# Patient Record
Sex: Female | Born: 1960 | Race: White | Hispanic: No | Marital: Married | Smoking: Never smoker
Health system: Southern US, Community
[De-identification: ages and names within clinical notes are randomized; demographics above are authoritative.]

## PROBLEM LIST (undated history)

## (undated) DIAGNOSIS — C19 Malignant neoplasm of rectosigmoid junction: Secondary | ICD-10-CM

## (undated) DIAGNOSIS — Z91018 Allergy to other foods: Secondary | ICD-10-CM

## (undated) DIAGNOSIS — F419 Anxiety disorder, unspecified: Secondary | ICD-10-CM

## (undated) DIAGNOSIS — G629 Polyneuropathy, unspecified: Secondary | ICD-10-CM

## (undated) HISTORY — DX: Anxiety disorder, unspecified: F41.9

## (undated) HISTORY — DX: Polyneuropathy, unspecified: G62.9

## (undated) HISTORY — DX: Allergy to other foods: Z91.018

---
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
## 2006-05-24 DIAGNOSIS — C19 Malignant neoplasm of rectosigmoid junction: Secondary | ICD-10-CM

## 2006-05-24 HISTORY — DX: Malignant neoplasm of rectosigmoid junction: C19

## 2007-03-16 ENCOUNTER — Ambulatory Visit (HOSPITAL_COMMUNITY): Admission: RE | Admit: 2007-03-16 | Discharge: 2007-03-16 | Payer: Self-pay | Admitting: Gastroenterology

## 2007-03-17 ENCOUNTER — Ambulatory Visit: Admission: RE | Admit: 2007-03-17 | Discharge: 2007-05-23 | Payer: Self-pay | Admitting: Radiation Oncology

## 2007-03-24 ENCOUNTER — Ambulatory Visit: Payer: Self-pay | Admitting: Gastroenterology

## 2009-10-16 ENCOUNTER — Encounter: Admission: RE | Admit: 2009-10-16 | Discharge: 2009-10-16 | Payer: Self-pay | Admitting: Neurosurgery

## 2009-12-01 ENCOUNTER — Encounter: Admission: RE | Admit: 2009-12-01 | Discharge: 2009-12-01 | Payer: Self-pay | Admitting: Neurosurgery

## 2010-04-02 ENCOUNTER — Ambulatory Visit: Payer: Self-pay | Admitting: Internal Medicine

## 2010-04-02 ENCOUNTER — Ambulatory Visit (HOSPITAL_COMMUNITY): Admission: RE | Admit: 2010-04-02 | Discharge: 2010-04-02 | Payer: Self-pay | Admitting: Internal Medicine

## 2011-04-01 ENCOUNTER — Encounter (INDEPENDENT_AMBULATORY_CARE_PROVIDER_SITE_OTHER): Payer: Self-pay | Admitting: *Deleted

## 2012-02-01 ENCOUNTER — Encounter: Payer: Self-pay | Admitting: Hematology and Oncology

## 2012-02-01 DIAGNOSIS — C20 Malignant neoplasm of rectum: Secondary | ICD-10-CM

## 2016-06-25 ENCOUNTER — Encounter: Payer: Self-pay | Admitting: Internal Medicine

## 2018-12-13 ENCOUNTER — Encounter (INDEPENDENT_AMBULATORY_CARE_PROVIDER_SITE_OTHER): Payer: Self-pay | Admitting: *Deleted

## 2018-12-13 ENCOUNTER — Other Ambulatory Visit: Payer: Self-pay

## 2018-12-13 ENCOUNTER — Ambulatory Visit (INDEPENDENT_AMBULATORY_CARE_PROVIDER_SITE_OTHER): Payer: Medicare HMO | Admitting: Internal Medicine

## 2018-12-13 ENCOUNTER — Encounter (INDEPENDENT_AMBULATORY_CARE_PROVIDER_SITE_OTHER): Payer: Self-pay | Admitting: Internal Medicine

## 2018-12-13 VITALS — BP 148/82 | HR 68 | Temp 98.2°F | Resp 18 | Ht 65.0 in | Wt 147.9 lb

## 2018-12-13 DIAGNOSIS — K5900 Constipation, unspecified: Secondary | ICD-10-CM | POA: Insufficient documentation

## 2018-12-13 DIAGNOSIS — Z85048 Personal history of other malignant neoplasm of rectum, rectosigmoid junction, and anus: Secondary | ICD-10-CM

## 2018-12-13 DIAGNOSIS — R634 Abnormal weight loss: Secondary | ICD-10-CM

## 2018-12-13 NOTE — Progress Notes (Signed)
Subjective:    Patient ID: Alexandra Irwin, female    DOB: 10-05-1960, 58 y.o.   MRN: [id number]  HPI: Alexandra Irwin is a 58 y.o. female with a past medical history significant for adenocarcinoma of the rectum diagnosed by colonoscopy 03/07/2007 completed by Dr. Laural Golden. She underwent chemotherapy and radiation therapy, s/p low anterior resection complicated by a postoperative anastomotic leak which required a colostomy then subsequent colostomy take down. She reported having surveillance colonoscopies every year for approximately  5 years then every 2 to 3 years there after. Her most recent colonoscopy was completed by  Dr. Orvan Falconer Oct or Nov. 2018. She was advised to repeat a colonoscopy in 3 years. She presents today with complains of increased mucous per the rectum sometimes associated with rectal pressure. No significant rectal pain. She reports having mucous per the rectum every since her rectal resection, however, the quantity of mucous has recently increased. She describes the mucous as yellow brown, sometimes with a reddish tint. No abdominal pain. She has chronic constipation, passes small balls of stool every few days. She used Linzess in the past which wiped her out. Miralax resulted in fecal leakage. She had bloody diarrhea with lower abdominal pain 8 months ago which lasted for 1 day. She contacted Dr. Jacqlyn Larsen physician assistant who advised close monitoring, endoscopic evaluation was not recommended. Her symptoms resolved without recurrence. She has infrequent episodes of a pinching discomfort to there right mid to lower abdomen which resolves after she stretches. I discussed possible adhesions from her prior surgeries may be contributing to these episodes. She denies feeling any bulge/hernia to the right mid abdomen. She endorses losing 10 lbs over the past year but has recently gained back 7lbs (usually weighs 150lbs, currently weighs 147lbs). No fever, sweats or chills. She wishes to schedule  a colonoscopy as she worries about rectal cancer recurrence. She elected to schedule her colonoscopy with Dr. Laural Golden as she had difficulty communication with Dr. Jacqlyn Larsen office. No other complaints today.   Colonoscopy reports in chart as follows: Surveillance colonoscopy 06/2008: a large ulcer at the colorectal anastomosis site and a small adenomatous polyp at the hepatic flexure.  Surveillance colonoscopy 04/02/2010: A pediatric Pentax colonoscopy was utilized. The colorectal anastomosis was identified 5cm proximal to the dentate line. The ulcer to which the lower aspect from 3 to 9 o'clock had completely healed. The ulcer towards the superior wall was partially healed, marked by clip in the center. No mass in this area. There were 3 small erosions in the region of the hepatic flexure site of colonic anastomosis from the takedown colostomy. The scope was passed into the cecum and the IC valve was visualized.  Colonoscopy 07/17/2010: Radiation ulceration, mild, at rectal anastomosis. Proximal anastomosis. No polyps. Small fistulous type recession seen on entry.   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,3-galactose    Anxiety    Colorectal cancer ([place]) 2008   Neuropathy     Past Surgical History:  Procedure Laterality Date   APPENDECTOMY     BREAST ENHANCEMENT SURGERY     CESAREAN SECTION     Patient has had x 2   COLECTOMY     COLONOSCOPY     COLOSTOMY     COLOSTOMY REVERSAL     HYSTERECTOMY ABDOMINAL WITH SALPINGO-OOPHORECTOMY     RECTAL SURGERY  2009   [facility]orectal surgery at [facility]   TONSILLECTOMY      No Known Allergies  Current Outpatient  Medications on File Prior to Visit  Medication Sig Dispense Refill   ergocalciferol (VITAMIN D2) 1.25 MG (50000 UT) capsule Take 50,000 Units by mouth once a week.     ibuprofen (ADVIL) 200 MG tablet Take 200 mg by mouth as needed for headache.     sertraline (ZOLOFT) 50 MG tablet Take  50 mg by mouth daily.     No current facility-administered medications on file prior to visit.      Review of Systems  Constitutional: 10 lbs wt loss, gained 7 lbs back Eyes: no visual deficits. Respiratory: Denies cough or SOB. Cardiovascular: Denies chest pain or palpitations. Gastrointestinal: See HPI. Denies dysphagia or heartburn. GU: + thickened vaginal wall, dyspareunia. No dysuria or hematuria. Musculoskeletal: + knee pain, past back pain. Neuro: LE paresthesia secondary to chemotherapy.     Objective:   Physical Exam  Head: Normocephalic. Eyes: No scleral icterus, conjunctiva pink. Mouth: Dentition intact, posterior pharynx pink. Neck: No lymphadenopathy or thyromegaly. Heart: S1,S2, no murmurs. Lungs: Clear throughout. Abdomen: Soft, nontender, + BS x 4 quads, RUQ scar and lower midline scar intact. No HSM. Rectal: Patient declined rectal exam. Extremities: No edema. Neuro: Alert and oriented x 4. No focal deficits.     Assessment & Plan:  1. 58 y.o. female with a hx or rectal cancer 2008 s/p LAR, chemo, radiation, colostomy, colostomy takedown who presents with complaints of constipation and increased mucous per there rectum. -schedule a colonoscopy with Dr. Laural Golden, patient request MIralax bowel prep - Colace 100mg  po bid - 2018 colonoscopy from Dr. Jacqlyn Larsen requested -further follow up to be determined after colonoscopy completed   2. Weight loss, resolving -continue to monitor weight -consider abd/pelvic CT if weight loss recurs

## 2018-12-13 NOTE — Patient Instructions (Signed)
1. Schedule a colonoscopy with Dr. Laural Golden 2. Follow up to be determined after colonoscopy completed. 3. Colace 100mg  1 capsule twice daily as needed

## 2018-12-18 ENCOUNTER — Other Ambulatory Visit (HOSPITAL_COMMUNITY): Payer: Medicare HMO

## 2018-12-20 ENCOUNTER — Other Ambulatory Visit (HOSPITAL_COMMUNITY)
Admission: RE | Admit: 2018-12-20 | Discharge: 2018-12-20 | Disposition: A | Discharge status: Home / Self Care | Payer: Medicare HMO | Source: Ambulatory Visit | Attending: Internal Medicine | Admitting: Internal Medicine

## 2018-12-20 ENCOUNTER — Other Ambulatory Visit: Payer: Self-pay

## 2018-12-20 ENCOUNTER — Encounter (HOSPITAL_COMMUNITY)
Admission: RE | Admit: 2018-12-20 | Discharge: 2018-12-20 | Disposition: A | Discharge status: Home / Self Care | Payer: Medicare HMO | Source: Ambulatory Visit | Attending: Internal Medicine | Admitting: Internal Medicine

## 2018-12-20 ENCOUNTER — Ambulatory Visit (HOSPITAL_COMMUNITY): Admit: 2018-12-20 | Payer: Medicare HMO | Admitting: Internal Medicine

## 2018-12-20 ENCOUNTER — Encounter (HOSPITAL_COMMUNITY): Payer: Self-pay

## 2018-12-20 DIAGNOSIS — Z20828 Contact with and (suspected) exposure to other viral communicable diseases: Secondary | ICD-10-CM | POA: Diagnosis present

## 2018-12-20 DIAGNOSIS — Z85048 Personal history of other malignant neoplasm of rectum, rectosigmoid junction, and anus: Secondary | ICD-10-CM

## 2018-12-20 DIAGNOSIS — R634 Abnormal weight loss: Secondary | ICD-10-CM

## 2018-12-20 LAB — SARS CORONAVIRUS 2 (TAT 6-24 HRS): SARS Coronavirus 2: NEGATIVE

## 2018-12-20 SURGERY — COLONOSCOPY
Anesthesia: Moderate Sedation

## 2018-12-22 ENCOUNTER — Ambulatory Visit (HOSPITAL_COMMUNITY)
Admission: RE | Admit: 2018-12-22 | Discharge: 2018-12-22 | Disposition: A | Discharge status: Home / Self Care | Payer: Medicare HMO | Attending: Internal Medicine | Admitting: Internal Medicine

## 2018-12-22 ENCOUNTER — Ambulatory Visit (HOSPITAL_COMMUNITY): Payer: Medicare HMO | Admitting: Anesthesiology

## 2018-12-22 ENCOUNTER — Encounter (HOSPITAL_COMMUNITY)
Admission: RE | Disposition: A | Discharge status: Home / Self Care | Payer: Self-pay | Source: Home / Self Care | Attending: Internal Medicine

## 2018-12-22 ENCOUNTER — Encounter (HOSPITAL_COMMUNITY): Payer: Self-pay | Admitting: *Deleted

## 2018-12-22 DIAGNOSIS — Z85048 Personal history of other malignant neoplasm of rectum, rectosigmoid junction, and anus: Secondary | ICD-10-CM | POA: Diagnosis not present

## 2018-12-22 DIAGNOSIS — Z9049 Acquired absence of other specified parts of digestive tract: Secondary | ICD-10-CM | POA: Insufficient documentation

## 2018-12-22 DIAGNOSIS — Z980 Intestinal bypass and anastomosis status: Secondary | ICD-10-CM | POA: Insufficient documentation

## 2018-12-22 DIAGNOSIS — K633 Ulcer of intestine: Secondary | ICD-10-CM

## 2018-12-22 DIAGNOSIS — F419 Anxiety disorder, unspecified: Secondary | ICD-10-CM | POA: Insufficient documentation

## 2018-12-22 DIAGNOSIS — Z79899 Other long term (current) drug therapy: Secondary | ICD-10-CM | POA: Insufficient documentation

## 2018-12-22 DIAGNOSIS — G629 Polyneuropathy, unspecified: Secondary | ICD-10-CM | POA: Insufficient documentation

## 2018-12-22 DIAGNOSIS — K6289 Other specified diseases of anus and rectum: Secondary | ICD-10-CM | POA: Diagnosis present

## 2018-12-22 DIAGNOSIS — K5900 Constipation, unspecified: Secondary | ICD-10-CM | POA: Diagnosis not present

## 2018-12-22 DIAGNOSIS — R634 Abnormal weight loss: Secondary | ICD-10-CM

## 2018-12-22 HISTORY — PX: BIOPSY: SHX5522

## 2018-12-22 HISTORY — PX: COLONOSCOPY WITH PROPOFOL: SHX5780

## 2018-12-22 SURGERY — COLONOSCOPY WITH PROPOFOL
Anesthesia: General

## 2018-12-22 MED ORDER — CHLORHEXIDINE GLUCONATE CLOTH 2 % EX PADS: 6.0000 | MEDICATED_PAD | Freq: Once | CUTANEOUS | Status: DC

## 2018-12-22 MED ORDER — PROMETHAZINE HCL 25 MG/ML IJ SOLN: 6.2500 mg | INTRAMUSCULAR | Status: DC | PRN

## 2018-12-22 MED ORDER — PROPOFOL 500 MG/50ML IV EMUL
INTRAVENOUS | Status: DC | PRN
  Administered 2018-12-22: 150 ug/kg/min via INTRAVENOUS

## 2018-12-22 MED ORDER — METAMUCIL SMOOTH TEXTURE 58.6 % PO POWD: 1.0000 | Freq: Every day | ORAL | 12 refills | Status: AC

## 2018-12-22 MED ORDER — HYDROCODONE-ACETAMINOPHEN 7.5-325 MG PO TABS: 1.0000 | ORAL_TABLET | Freq: Once | ORAL | Status: DC | PRN

## 2018-12-22 MED ORDER — HYDROMORPHONE HCL 1 MG/ML IJ SOLN: 0.2500 mg | INTRAMUSCULAR | Status: DC | PRN

## 2018-12-22 MED ORDER — DOCUSATE SODIUM 100 MG PO CAPS: 200.0000 mg | ORAL_CAPSULE | Freq: Every day | ORAL | 0 refills | Status: AC

## 2018-12-22 MED ORDER — PROPOFOL 10 MG/ML IV BOLUS
INTRAVENOUS | Status: DC | PRN
  Administered 2018-12-22: 4 mg via INTRAVENOUS
  Administered 2018-12-22: 20 mg via INTRAVENOUS

## 2018-12-22 MED ORDER — MIDAZOLAM HCL 2 MG/2ML IJ SOLN: 0.5000 mg | Freq: Once | INTRAMUSCULAR | Status: DC | PRN

## 2018-12-22 MED ORDER — LACTATED RINGERS IV SOLN
INTRAVENOUS | Status: DC
  Administered 2018-12-22: 1000 mL via INTRAVENOUS

## 2018-12-22 NOTE — Op Note (Addendum)
[facility] Patient Name: Alexandra Irwin Procedure Date: 12/22/2018 8:02 AM MRN: [id number] Date of Birth: July 13, 1960 Attending MD: Hildred Laser , MD CSN: [id number] Age: 58 Admit Type: Outpatient Procedure:                Colonoscopy Indications:              Constipation, rectal discharge Providers:                Hildred Laser, MD, Otis Peak B. Sharon Seller, RN, Nelma Rothman, Technician Referring MD:             Theodosia Paling, FNP Medicines:                Propofol per Anesthesia Complications:            No immediate complications. Estimated Blood Loss:     Estimated blood loss was minimal. Procedure:                Pre-Anesthesia Assessment:                           - Prior to the procedure, a History and Physical                            was performed, and patient medications and                            allergies were reviewed. The patient's tolerance of                            previous anesthesia was also reviewed. The risks                            and benefits of the procedure and the sedation                            options and risks were discussed with the patient.                            All questions were answered, and informed consent                            was obtained. Prior Anticoagulants: The patient has                            taken no previous anticoagulant or antiplatelet                            agents except for NSAID medication. ASA Grade                            Assessment: II - A patient with mild systemic  disease. After reviewing the risks and benefits,                            the patient was deemed in satisfactory condition to                            undergo the procedure.                           After obtaining informed consent, the colonoscope                            was passed under direct vision. Throughout the                            procedure, the patient's  blood pressure, pulse, and                            oxygen saturations were monitored continuously. The                            [id number]-[id number] ([id number]) scope was introduced through                            the anus and advanced to the the cecum, identified                            by appendiceal orifice and ileocecal valve. The                            colonoscopy was performed without difficulty. The                            patient tolerated the procedure well. The quality                            of the bowel preparation was adequate. The                            ileocecal valve, appendiceal orifice, and rectum                            were photographed. Scope In: 8:19:04 AM Scope Out: 8:32:55 AM Scope Withdrawal Time: 0 hours 9 minutes 29 seconds  Total Procedure Duration: 0 hours 13 minutes 51 seconds  Findings:      The perianal and digital rectal examinations were normal.      There was evidence of a prior end-to-end colo-colonic anastomosis at the       hepatic flexure. This was patent.      A few patchy non-bleeding erosions were found in the transverse colon.       Biopsies were taken with a cold forceps for histology. The pathology       specimen was placed into Bottle Number 1.      There was evidence of a prior end-to-end colo-colonic  anastomosis at 6       cm proximal to the anus. This was patent. Impression:               - Patent end-to-end colo-colonic anastomosis.                           - A few erosions in the transverse colon. Biopsied.                           - Patent end-to-end colo-colonic anastomosis. Moderate Sedation:      Per Anesthesia Care Recommendation:           - Patient has a contact number available for                            emergencies. The signs and symptoms of potential                            delayed complications were discussed with the                            patient. Return to normal activities tomorrow.                             Written discharge instructions were provided to the                            patient.                           - High fiber diet today.                           - Continue present medications.                           - Metamucil 4 g po qhs.                           - colace 200 mg po qhs.                           - Await pathology results.                           - Repeat colonoscopy in 3 years for surveillance. Procedure Code(s):        --- Professional ---                           [phone], Colonoscopy, flexible; with biopsy, single                            or multiple Diagnosis Code(s):        --- Professional ---                           Z98.0, Intestinal bypass and anastomosis  status                           K63.3, Ulcer of intestine                           K59.00, Constipation, unspecified CPT copyright 2019 American Medical Association. All rights reserved. The codes documented in this report are preliminary and upon coder review may  be revised to meet current compliance requirements. Hildred Laser, MD Hildred Laser, MD 12/22/2018 8:45:19 AM This report has been signed electronically. Number of Addenda: 0

## 2018-12-22 NOTE — Anesthesia Postprocedure Evaluation (Signed)
Anesthesia Post Note  Patient: Alexandra Irwin  Procedure(s) Performed: COLONOSCOPY WITH PROPOFOL (N/A ) BIOPSY  Patient location during evaluation: PACU Anesthesia Type: General Level of consciousness: awake and alert and oriented Pain management: pain level controlled Vital Signs Assessment: post-procedure vital signs reviewed and stable Respiratory status: spontaneous breathing Cardiovascular status: blood pressure returned to baseline and stable Postop Assessment: no apparent nausea or vomiting Anesthetic complications: no     Last Vitals:  Vitals:   12/22/18 0723 12/22/18 0839  BP: 132/68   Resp: 18   Temp: 36.8 C (P) 36.6 C  SpO2: 98%     Last Pain:  Vitals:   12/22/18 0813  TempSrc:   PainSc: 0-No pain                 Chandlar Guice

## 2018-12-22 NOTE — Anesthesia Preprocedure Evaluation (Signed)
Anesthesia Evaluation  Patient identified by MRN, date of birth, ID band Patient awake    Reviewed: Allergy & Precautions, NPO status , Patient's Chart, lab work & pertinent test results  Airway Mallampati: I  TM Distance: >3 FB Neck ROM: Full    Dental no notable dental hx. (+) Teeth Intact   Pulmonary neg pulmonary ROS,    Pulmonary exam normal breath sounds clear to auscultation       Cardiovascular Exercise Tolerance: Good negative cardio ROS Normal cardiovascular examI Rhythm:Regular Rate:Normal     Neuro/Psych Anxiety negative neurological ROS  negative psych ROS   GI/Hepatic negative GI ROS, Neg liver ROS, H/o Recatal Ca  S/p CT/RT 2008, then surg 2009 , then takedown 2009  Doing well  Here for surveillance    Endo/Other  negative endocrine ROS  Renal/GU negative Renal ROS  negative genitourinary   Musculoskeletal negative musculoskeletal ROS (+)   Abdominal   Peds negative pediatric ROS (+)  Hematology negative hematology ROS (+)   Anesthesia Other Findings   Reproductive/Obstetrics negative OB ROS                             Anesthesia Physical Anesthesia Plan  ASA: II  Anesthesia Plan: General   Post-op Pain Management:    Induction: Intravenous  PONV Risk Score and Plan: 3 and Propofol infusion, TIVA, Treatment may vary due to age or medical condition and Ondansetron  Airway Management Planned: Nasal Cannula and Simple Face Mask  Additional Equipment:   Intra-op Plan:   Post-operative Plan: Extubation in OR  Informed Consent: I have reviewed the patients History and Physical, chart, labs and discussed the procedure including the risks, benefits and alternatives for the proposed anesthesia with the patient or authorized representative who has indicated his/her understanding and acceptance.     Dental advisory given  Plan Discussed with: CRNA  Anesthesia  Plan Comments: (Plan Full PPE use  Plan GA with GETA as needed d/w pt -WTP with same after Q&A  Very pleasant )        Anesthesia Quick Evaluation

## 2018-12-22 NOTE — H&P (Signed)
Alexandra Irwin is an 58 y.o. female.   Chief Complaint: Patient is here for colonoscopy. HPI: Patient is 58 year old Caucasian female who has a history of rectal carcinoma dating back to October 2008 which was treated with chemoradiation followed by low anterior resection complicated by anastomotic leak requiring colostomy.  She therefore she also had issues with colostomy site infection.  Last colonoscopy was 2 years ago.  She has noted change in her bowel habits.  She is having constipation.  She is also passing mucus per rectum.  She denies abdominal pain.  She also gives history of weight loss.  Patient states she tried Linzess but it caused her to have excessive diarrhea with incontinence.  Past Medical History:  Diagnosis Date  . 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 Anxiety   . Colorectal cancer ([place]) 2008  . Neuropathy     Past Surgical History:  Procedure Laterality Date  . APPENDECTOMY    . BREAST ENHANCEMENT SURGERY    . CESAREAN SECTION     Patient has had x 2  . COLECTOMY    . COLONOSCOPY    . COLOSTOMY    . COLOSTOMY REVERSAL    . HYSTERECTOMY ABDOMINAL WITH SALPINGO-OOPHORECTOMY    . RECTAL SURGERY  2009   colorectal [facility] at [facility]  . TONSILLECTOMY      Family History  Problem Relation Age of Onset  . Heart disease Mother   . Lung cancer Father   . Healthy Sister   . Healthy Son   . Healthy Son    Social History:  reports that she has never smoked. She has never used smokeless tobacco. She reports that she does not drink alcohol or use drugs.  Allergies: No Known Allergies  Medications Prior to Admission  Medication Sig Dispense Refill  . ergocalciferol (VITAMIN D2) 1.25 MG (50000 UT) capsule Take 50,000 Units by mouth once a week.    Marland Kitchen ibuprofen (ADVIL) 200 MG tablet Take 200 mg by mouth every 6 (six) hours as needed for headache.     . sertraline (ZOLOFT) 50 MG tablet Take 50 mg by mouth daily.      No results found for this or any  previous visit (from the past 48 hour(s)). No results found.  ROS  Blood pressure 132/68, temperature 98.3 F (36.8 C), temperature source Oral, resp. rate 18, SpO2 98 %. Physical Exam  Constitutional: She appears well-developed and well-nourished.  HENT:  Mouth/Throat: Oropharynx is clear and moist.  Eyes: Conjunctivae are normal. No scleral icterus.  Neck: No thyromegaly present.  Cardiovascular: Normal rate, regular rhythm and normal heart sounds.  No murmur heard. Respiratory: Effort normal and breath sounds normal.  GI:  Abdomen is symmetrical.  She has lower midline scar.  She also has long scar in right upper quadrant.  Abdomen is soft and nontender with organomegaly or masses.  Musculoskeletal:        General: No edema.  Neurological: She is alert.  Skin: Skin is warm and dry.     Assessment/Plan History of rectal cancer. Rectal discharge and constipation. Diagnostic colonoscopy.  Hildred Laser, MD 12/22/2018, 8:06 AM

## 2018-12-22 NOTE — Transfer of Care (Signed)
Immediate Anesthesia Transfer of Care Note  Patient: Alexandra Irwin  Procedure(s) Performed: COLONOSCOPY WITH PROPOFOL (N/A ) BIOPSY  Patient Location: PACU  Anesthesia Type:General  Level of Consciousness: awake  Airway & Oxygen Therapy: Patient Spontanous Breathing  Post-op Assessment: Report given to RN  Post vital signs: Reviewed  Last Vitals:  Vitals Value Taken Time  BP 104/54 12/22/18 0839  Temp    Pulse 56 12/22/18 0841  Resp 16 12/22/18 0841  SpO2 98 % 12/22/18 0841  Vitals shown include unvalidated device data.  Last Pain:  Vitals:   12/22/18 0813  TempSrc:   PainSc: 0-No pain      Patients Stated Pain Goal: 7 ([id number])  Complications: No apparent anesthesia complications

## 2018-12-22 NOTE — Discharge Instructions (Signed)
No aspirin or NSAIDs for 24 hours. Resume other medications as before. Colace 200 mg by mouth daily at bedtime  High-fiber diet. Metamucil 3 to 4 g by mouth daily at bedtime. No driving for 24 hours. Physician will call with biopsy results. Next colonoscopy in 3 years.      Colonoscopy, Adult, Care After This sheet gives you information about how to care for yourself after your procedure. Your doctor may also give you more specific instructions. If you have problems or questions, call your doctor. What can I expect after the procedure? After the procedure, it is common to have:  A small amount of blood in your poop for 24 hours.  Some gas.  Mild cramping or bloating in your belly. Follow these instructions at home: General instructions  For the first 24 hours after the procedure: ? Do not drive or use machinery. ? Do not sign important documents. ? Do not drink alcohol. ? Do your daily activities more slowly than normal. ? Eat foods that are soft and easy to digest.  Take over-the-counter or prescription medicines only as told by your doctor. To help cramping and bloating:   Try walking around.  Put heat on your belly (abdomen) as told by your doctor. Use a heat source that your doctor recommends, such as a moist heat pack or a heating pad. ? Put a towel between your skin and the heat source. ? Leave the heat on for 20-30 minutes. ? Remove the heat if your skin turns bright red. This is especially important if you cannot feel pain, heat, or cold. You can get burned. Eating and drinking   Drink enough fluid to keep your pee (urine) clear or pale yellow.  Return to your normal diet as told by your doctor. Avoid heavy or fried foods that are hard to digest.  Avoid drinking alcohol for as long as told by your doctor. Contact a doctor if:  You have blood in your poop (stool) 2-3 days after the procedure. Get help right away if:  You have more than a small amount of  blood in your poop.  You see large clumps of tissue (blood clots) in your poop.  Your belly is swollen.  You feel sick to your stomach (nauseous).  You throw up (vomit).  You have a fever.  You have belly pain that gets worse, and medicine does not help your pain. Summary  After the procedure, it is common to have a small amount of blood in your poop. You may also have mild cramping and bloating in your belly.  For the first 24 hours after the procedure, do not drive or use machinery, do not sign important documents, and do not drink alcohol.  Get help right away if you have a lot of blood in your poop, feel sick to your stomach, have a fever, or have more belly pain. This information is not intended to replace advice given to you by your health care provider. Make sure you discuss any questions you have with your health care provider. Document Released: 06/12/2010 Document Revised: 03/10/2017 Document Reviewed: 02/02/2016 Elsevier Patient Education  [place].     High-Fiber Diet Fiber, also called dietary fiber, is a type of carbohydrate that is found in fruits, vegetables, whole grains, and beans. A high-fiber diet can have many health benefits. Your health care provider may recommend a high-fiber diet to help:  Prevent constipation. Fiber can make your bowel movements more regular.  Lower your  cholesterol.  Relieve the following conditions: ? Swelling of veins in the anus (hemorrhoids). ? Swelling and irritation (inflammation) of specific areas of the digestive tract (uncomplicated diverticulosis). ? A problem of the large intestine (colon) that sometimes causes pain and diarrhea (irritable bowel syndrome, IBS).  Prevent overeating as part of a weight-loss plan.  Prevent heart disease, type 2 diabetes, and certain cancers. What is my plan? The recommended daily fiber intake in grams (g) includes:  38 g for men age 37 or younger.  30 g for men over age  54.  55 g for women age 29 or younger.  21 g for women over age 67. You can get the recommended daily intake of dietary fiber by:  Eating a variety of fruits, vegetables, grains, and beans.  Taking a fiber supplement, if it is not possible to get enough fiber through your diet. What do I need to know about a high-fiber diet?  It is better to get fiber through food sources rather than from fiber supplements. There is not a lot of research about how effective supplements are.  Always check the fiber content on the nutrition facts label of any prepackaged food. Look for foods that contain 5 g of fiber or more per serving.  Talk with a diet and nutrition specialist (dietitian) if you have questions about specific foods that are recommended or not recommended for your medical condition, especially if those foods are not listed below.  Gradually increase how much fiber you consume. If you increase your intake of dietary fiber too quickly, you may have bloating, cramping, or gas.  Drink plenty of water. Water helps you to digest fiber. What are tips for following this plan?  Eat a wide variety of high-fiber foods.  Make sure that half of the grains that you eat each day are whole grains.  Eat breads and cereals that are made with whole-grain flour instead of refined flour or [place] flour.  Eat brown rice, bulgur wheat, or millet instead of [place] rice.  Start the day with a breakfast that is high in fiber, such as a cereal that contains 5 g of fiber or more per serving.  Use beans in place of meat in soups, salads, and pasta dishes.  Eat high-fiber snacks, such as berries, raw vegetables, nuts, and popcorn.  Choose whole fruits and vegetables instead of processed forms like juice or sauce. What foods can I eat?  Fruits Berries. Pears. Apples. Oranges. Avocado. Prunes and raisins. Dried figs. Vegetables Sweet potatoes. Spinach. Kale. Artichokes. Cabbage. Broccoli. Cauliflower. Green  peas. Carrots. Squash. Grains Whole-grain breads. Multigrain cereal. Oats and oatmeal. Brown rice. Barley. Bulgur wheat. [place]. Quinoa. Bran muffins. Popcorn. Rye wafer crackers. Meats and other proteins [place], kidney, and pinto beans. Soybeans. Split peas. Lentils. Nuts and seeds. Dairy Fiber-fortified yogurt. Beverages Fiber-fortified soy milk. Fiber-fortified orange juice. Other foods Fiber bars. The items listed above may not be a complete list of recommended foods and beverages. Contact a dietitian for more options. What foods are not recommended? Fruits Fruit juice. Cooked, strained fruit. Vegetables Fried potatoes. Canned vegetables. Well-cooked vegetables. Grains [place] bread. Pasta made with refined flour. [place] rice. Meats and other proteins Fatty cuts of meat. Fried chicken or fried fish. Dairy Milk. Yogurt. Cream cheese. Sour cream. Fats and oils [place]. Beverages Soft drinks. Other foods Cakes and pastries. The items listed above may not be a complete list of foods and beverages to avoid. Contact a dietitian for more information. Summary  Fiber  is a type of carbohydrate. It is found in fruits, vegetables, whole grains, and beans.  There are many health benefits of eating a high-fiber diet, such as preventing constipation, lowering blood cholesterol, helping with weight loss, and reducing your risk of heart disease, diabetes, and certain cancers.  Gradually increase your intake of fiber. Increasing too fast can result in cramping, bloating, and gas. Drink plenty of water while you increase your fiber.  The best sources of fiber include whole fruits and vegetables, whole grains, nuts, seeds, and beans. This information is not intended to replace advice given to you by your health care provider. Make sure you discuss any questions you have with your health care provider. Document Released: 05/10/2005 Document Revised: 03/14/2017 Document Reviewed:  03/14/2017 Elsevier Patient Education  2020 [place] After These instructions provide you with information about caring for yourself after your procedure. Your health care provider may also give you more specific instructions. Your treatment has been planned according to current medical practices, but problems sometimes occur. Call your health care provider if you have any problems or questions after your procedure. What can I expect after the procedure? After your procedure, you may:  Feel sleepy for several hours.  Feel clumsy and have poor balance for several hours.  Feel forgetful about what happened after the procedure.  Have poor judgment for several hours.  Feel nauseous or vomit.  Have a sore throat if you had a breathing tube during the procedure. Follow these instructions at home: For at least 24 hours after the procedure:      Have a responsible adult stay with you. It is important to have someone help care for you until you are awake and alert.  Rest as needed.  Do not: ? Participate in activities in which you could fall or become injured. ? Drive. ? Use heavy machinery. ? Drink alcohol. ? Take sleeping pills or medicines that cause drowsiness. ? Make important decisions or sign legal documents. ? Take care of children on your own. Eating and drinking  Follow the diet that is recommended by your health care provider.  If you vomit, drink water, juice, or soup when you can drink without vomiting.  Make sure you have little or no nausea before eating solid foods. General instructions  Take over-the-counter and prescription medicines only as told by your health care provider.  If you have sleep apnea, surgery and certain medicines can increase your risk for breathing problems. Follow instructions from your health care provider about wearing your sleep device: ? Anytime you are sleeping, including during daytime  naps. ? While taking prescription pain medicines, sleeping medicines, or medicines that make you drowsy.  If you smoke, do not smoke without supervision.  Keep all follow-up visits as told by your health care provider. This is important. Contact a health care provider if:  You keep feeling nauseous or you keep vomiting.  You feel light-headed.  You develop a rash.  You have a fever. Get help right away if:  You have trouble breathing. Summary  For several hours after your procedure, you may feel sleepy and have poor judgment.  Have a responsible adult stay with you for at least 24 hours or until you are awake and alert. This information is not intended to replace advice given to you by your health care provider. Make sure you discuss any questions you have with your health care provider. Document Released: 08/31/2015  Document Revised: 08/08/2017 Document Reviewed: 08/31/2015 Elsevier Patient Education  [place].

## 2018-12-25 ENCOUNTER — Ambulatory Visit (INDEPENDENT_AMBULATORY_CARE_PROVIDER_SITE_OTHER): Payer: Medicare HMO | Admitting: Nurse Practitioner

## 2019-01-02 ENCOUNTER — Encounter (HOSPITAL_COMMUNITY): Payer: Self-pay | Admitting: Internal Medicine
